# Patient Record
Sex: Female | Born: 1960 | Race: White | Hispanic: No | Marital: Married | Smoking: Former smoker
Health system: Southern US, Community
[De-identification: ages and names within clinical notes are randomized; demographics above are authoritative.]

## PROBLEM LIST (undated history)

## (undated) DIAGNOSIS — E785 Hyperlipidemia, unspecified: Secondary | ICD-10-CM

## (undated) HISTORY — DX: Hyperlipidemia, unspecified: E78.5

---

## 2011-10-19 ENCOUNTER — Other Ambulatory Visit: Payer: Self-pay | Admitting: Anesthesiology

## 2011-10-19 DIAGNOSIS — Z1231 Encounter for screening mammogram for malignant neoplasm of breast: Secondary | ICD-10-CM

## 2011-11-16 ENCOUNTER — Ambulatory Visit
Admission: RE | Admit: 2011-11-16 | Discharge: 2011-11-16 | Disposition: A | Discharge status: Home / Self Care | Payer: BC Managed Care – PPO | Source: Ambulatory Visit | Attending: Anesthesiology | Admitting: Anesthesiology

## 2011-11-16 DIAGNOSIS — Z1231 Encounter for screening mammogram for malignant neoplasm of breast: Secondary | ICD-10-CM

## 2012-11-14 ENCOUNTER — Other Ambulatory Visit: Payer: Self-pay | Admitting: Internal Medicine

## 2012-11-14 DIAGNOSIS — Z1231 Encounter for screening mammogram for malignant neoplasm of breast: Secondary | ICD-10-CM

## 2012-12-23 ENCOUNTER — Ambulatory Visit
Admission: RE | Admit: 2012-12-23 | Discharge: 2012-12-23 | Disposition: A | Discharge status: Home / Self Care | Payer: BC Managed Care – PPO | Source: Ambulatory Visit | Attending: Internal Medicine | Admitting: Internal Medicine

## 2012-12-23 DIAGNOSIS — Z1231 Encounter for screening mammogram for malignant neoplasm of breast: Secondary | ICD-10-CM

## 2013-12-01 ENCOUNTER — Other Ambulatory Visit: Payer: Self-pay

## 2013-12-01 DIAGNOSIS — Z1231 Encounter for screening mammogram for malignant neoplasm of breast: Secondary | ICD-10-CM

## 2013-12-29 ENCOUNTER — Ambulatory Visit
Admission: RE | Admit: 2013-12-29 | Discharge: 2013-12-29 | Disposition: A | Discharge status: Home / Self Care | Payer: BC Managed Care – PPO | Source: Ambulatory Visit

## 2013-12-29 DIAGNOSIS — Z1231 Encounter for screening mammogram for malignant neoplasm of breast: Secondary | ICD-10-CM

## 2014-01-12 ENCOUNTER — Other Ambulatory Visit (HOSPITAL_COMMUNITY)
Admission: RE | Admit: 2014-01-12 | Discharge: 2014-01-12 | Disposition: A | Discharge status: Home / Self Care | Payer: BC Managed Care – PPO | Source: Ambulatory Visit | Attending: Internal Medicine | Admitting: Internal Medicine

## 2014-01-12 DIAGNOSIS — Z01419 Encounter for gynecological examination (general) (routine) without abnormal findings: Secondary | ICD-10-CM | POA: Insufficient documentation

## 2014-12-14 ENCOUNTER — Other Ambulatory Visit: Payer: Self-pay

## 2014-12-14 DIAGNOSIS — Z1231 Encounter for screening mammogram for malignant neoplasm of breast: Secondary | ICD-10-CM

## 2014-12-30 ENCOUNTER — Ambulatory Visit
Admission: RE | Admit: 2014-12-30 | Discharge: 2014-12-30 | Disposition: A | Discharge status: Home / Self Care | Payer: BLUE CROSS/BLUE SHIELD | Source: Ambulatory Visit

## 2014-12-30 DIAGNOSIS — Z1231 Encounter for screening mammogram for malignant neoplasm of breast: Secondary | ICD-10-CM

## 2015-12-30 ENCOUNTER — Other Ambulatory Visit: Payer: Self-pay

## 2015-12-30 DIAGNOSIS — Z1231 Encounter for screening mammogram for malignant neoplasm of breast: Secondary | ICD-10-CM

## 2016-01-14 ENCOUNTER — Ambulatory Visit
Admission: RE | Admit: 2016-01-14 | Discharge: 2016-01-14 | Disposition: A | Discharge status: Home / Self Care | Payer: BLUE CROSS/BLUE SHIELD | Source: Ambulatory Visit

## 2016-01-14 DIAGNOSIS — Z1231 Encounter for screening mammogram for malignant neoplasm of breast: Secondary | ICD-10-CM

## 2016-12-25 ENCOUNTER — Other Ambulatory Visit: Payer: Self-pay | Admitting: Internal Medicine

## 2016-12-25 DIAGNOSIS — Z1231 Encounter for screening mammogram for malignant neoplasm of breast: Secondary | ICD-10-CM

## 2017-01-16 ENCOUNTER — Ambulatory Visit
Admission: RE | Admit: 2017-01-16 | Discharge: 2017-01-16 | Disposition: A | Discharge status: Home / Self Care | Payer: BLUE CROSS/BLUE SHIELD | Source: Ambulatory Visit | Attending: Internal Medicine | Admitting: Internal Medicine

## 2017-01-16 DIAGNOSIS — Z1231 Encounter for screening mammogram for malignant neoplasm of breast: Secondary | ICD-10-CM | POA: Diagnosis not present

## 2017-02-21 DIAGNOSIS — N390 Urinary tract infection, site not specified: Secondary | ICD-10-CM | POA: Diagnosis not present

## 2017-02-21 DIAGNOSIS — E559 Vitamin D deficiency, unspecified: Secondary | ICD-10-CM | POA: Diagnosis not present

## 2017-02-21 DIAGNOSIS — Z0001 Encounter for general adult medical examination with abnormal findings: Secondary | ICD-10-CM | POA: Diagnosis not present

## 2017-02-21 DIAGNOSIS — R7301 Impaired fasting glucose: Secondary | ICD-10-CM | POA: Diagnosis not present

## 2017-02-23 DIAGNOSIS — D2261 Melanocytic nevi of right upper limb, including shoulder: Secondary | ICD-10-CM | POA: Diagnosis not present

## 2017-02-23 DIAGNOSIS — D2362 Other benign neoplasm of skin of left upper limb, including shoulder: Secondary | ICD-10-CM | POA: Diagnosis not present

## 2017-02-23 DIAGNOSIS — D2272 Melanocytic nevi of left lower limb, including hip: Secondary | ICD-10-CM | POA: Diagnosis not present

## 2017-02-23 DIAGNOSIS — D225 Melanocytic nevi of trunk: Secondary | ICD-10-CM | POA: Diagnosis not present

## 2017-02-28 DIAGNOSIS — E559 Vitamin D deficiency, unspecified: Secondary | ICD-10-CM | POA: Diagnosis not present

## 2017-02-28 DIAGNOSIS — Z0000 Encounter for general adult medical examination without abnormal findings: Secondary | ICD-10-CM | POA: Diagnosis not present

## 2017-02-28 DIAGNOSIS — R7301 Impaired fasting glucose: Secondary | ICD-10-CM | POA: Diagnosis not present

## 2017-02-28 DIAGNOSIS — E785 Hyperlipidemia, unspecified: Secondary | ICD-10-CM | POA: Diagnosis not present

## 2018-02-22 ENCOUNTER — Other Ambulatory Visit: Payer: Self-pay | Admitting: Internal Medicine

## 2018-02-22 DIAGNOSIS — Z139 Encounter for screening, unspecified: Secondary | ICD-10-CM

## 2018-03-01 DIAGNOSIS — C44519 Basal cell carcinoma of skin of other part of trunk: Secondary | ICD-10-CM | POA: Diagnosis not present

## 2018-03-01 DIAGNOSIS — D2261 Melanocytic nevi of right upper limb, including shoulder: Secondary | ICD-10-CM | POA: Diagnosis not present

## 2018-03-01 DIAGNOSIS — D225 Melanocytic nevi of trunk: Secondary | ICD-10-CM | POA: Diagnosis not present

## 2018-03-01 DIAGNOSIS — D171 Benign lipomatous neoplasm of skin and subcutaneous tissue of trunk: Secondary | ICD-10-CM | POA: Diagnosis not present

## 2018-03-01 DIAGNOSIS — D2362 Other benign neoplasm of skin of left upper limb, including shoulder: Secondary | ICD-10-CM | POA: Diagnosis not present

## 2018-03-08 DIAGNOSIS — C44519 Basal cell carcinoma of skin of other part of trunk: Secondary | ICD-10-CM | POA: Diagnosis not present

## 2018-03-26 ENCOUNTER — Ambulatory Visit
Admission: RE | Admit: 2018-03-26 | Discharge: 2018-03-26 | Disposition: A | Discharge status: Home / Self Care | Payer: BLUE CROSS/BLUE SHIELD | Source: Ambulatory Visit | Attending: Internal Medicine | Admitting: Internal Medicine

## 2018-03-26 DIAGNOSIS — Z1231 Encounter for screening mammogram for malignant neoplasm of breast: Secondary | ICD-10-CM | POA: Diagnosis not present

## 2018-03-26 DIAGNOSIS — Z139 Encounter for screening, unspecified: Secondary | ICD-10-CM

## 2018-09-23 DIAGNOSIS — L03012 Cellulitis of left finger: Secondary | ICD-10-CM | POA: Diagnosis not present

## 2018-11-13 DIAGNOSIS — E785 Hyperlipidemia, unspecified: Secondary | ICD-10-CM | POA: Diagnosis not present

## 2018-11-13 DIAGNOSIS — S6990XA Unspecified injury of unspecified wrist, hand and finger(s), initial encounter: Secondary | ICD-10-CM | POA: Diagnosis not present

## 2018-12-16 DIAGNOSIS — S61211A Laceration without foreign body of left index finger without damage to nail, initial encounter: Secondary | ICD-10-CM | POA: Diagnosis not present

## 2018-12-18 DIAGNOSIS — M25542 Pain in joints of left hand: Secondary | ICD-10-CM | POA: Diagnosis not present

## 2018-12-18 DIAGNOSIS — R609 Edema, unspecified: Secondary | ICD-10-CM | POA: Diagnosis not present

## 2018-12-18 DIAGNOSIS — M25642 Stiffness of left hand, not elsewhere classified: Secondary | ICD-10-CM | POA: Diagnosis not present

## 2018-12-18 DIAGNOSIS — M6281 Muscle weakness (generalized): Secondary | ICD-10-CM | POA: Diagnosis not present

## 2018-12-20 DIAGNOSIS — N390 Urinary tract infection, site not specified: Secondary | ICD-10-CM | POA: Diagnosis not present

## 2018-12-20 DIAGNOSIS — R7301 Impaired fasting glucose: Secondary | ICD-10-CM | POA: Diagnosis not present

## 2018-12-20 DIAGNOSIS — E785 Hyperlipidemia, unspecified: Secondary | ICD-10-CM | POA: Diagnosis not present

## 2018-12-20 DIAGNOSIS — Z0000 Encounter for general adult medical examination without abnormal findings: Secondary | ICD-10-CM | POA: Diagnosis not present

## 2018-12-25 DIAGNOSIS — M6281 Muscle weakness (generalized): Secondary | ICD-10-CM | POA: Diagnosis not present

## 2018-12-25 DIAGNOSIS — M25642 Stiffness of left hand, not elsewhere classified: Secondary | ICD-10-CM | POA: Diagnosis not present

## 2018-12-25 DIAGNOSIS — M25542 Pain in joints of left hand: Secondary | ICD-10-CM | POA: Diagnosis not present

## 2018-12-25 DIAGNOSIS — R609 Edema, unspecified: Secondary | ICD-10-CM | POA: Diagnosis not present

## 2018-12-27 DIAGNOSIS — Z0000 Encounter for general adult medical examination without abnormal findings: Secondary | ICD-10-CM | POA: Diagnosis not present

## 2018-12-27 DIAGNOSIS — R7301 Impaired fasting glucose: Secondary | ICD-10-CM | POA: Diagnosis not present

## 2018-12-27 DIAGNOSIS — E785 Hyperlipidemia, unspecified: Secondary | ICD-10-CM | POA: Diagnosis not present

## 2018-12-27 DIAGNOSIS — S6990XA Unspecified injury of unspecified wrist, hand and finger(s), initial encounter: Secondary | ICD-10-CM | POA: Diagnosis not present

## 2019-01-01 DIAGNOSIS — M25542 Pain in joints of left hand: Secondary | ICD-10-CM | POA: Diagnosis not present

## 2019-01-01 DIAGNOSIS — M25642 Stiffness of left hand, not elsewhere classified: Secondary | ICD-10-CM | POA: Diagnosis not present

## 2019-01-01 DIAGNOSIS — R609 Edema, unspecified: Secondary | ICD-10-CM | POA: Diagnosis not present

## 2019-01-01 DIAGNOSIS — M6281 Muscle weakness (generalized): Secondary | ICD-10-CM | POA: Diagnosis not present

## 2019-01-14 DIAGNOSIS — M25542 Pain in joints of left hand: Secondary | ICD-10-CM | POA: Diagnosis not present

## 2019-01-14 DIAGNOSIS — M6281 Muscle weakness (generalized): Secondary | ICD-10-CM | POA: Diagnosis not present

## 2019-01-14 DIAGNOSIS — M25642 Stiffness of left hand, not elsewhere classified: Secondary | ICD-10-CM | POA: Diagnosis not present

## 2019-01-14 DIAGNOSIS — R609 Edema, unspecified: Secondary | ICD-10-CM | POA: Diagnosis not present

## 2019-01-15 DIAGNOSIS — S61211D Laceration without foreign body of left index finger without damage to nail, subsequent encounter: Secondary | ICD-10-CM | POA: Diagnosis not present

## 2020-02-06 DIAGNOSIS — N390 Urinary tract infection, site not specified: Secondary | ICD-10-CM | POA: Diagnosis not present

## 2020-02-06 DIAGNOSIS — E559 Vitamin D deficiency, unspecified: Secondary | ICD-10-CM | POA: Diagnosis not present

## 2020-02-06 DIAGNOSIS — E7800 Pure hypercholesterolemia, unspecified: Secondary | ICD-10-CM | POA: Diagnosis not present

## 2020-02-06 DIAGNOSIS — Z0000 Encounter for general adult medical examination without abnormal findings: Secondary | ICD-10-CM | POA: Diagnosis not present

## 2020-03-02 DIAGNOSIS — D2262 Melanocytic nevi of left upper limb, including shoulder: Secondary | ICD-10-CM | POA: Diagnosis not present

## 2020-03-02 DIAGNOSIS — D225 Melanocytic nevi of trunk: Secondary | ICD-10-CM | POA: Diagnosis not present

## 2020-03-02 DIAGNOSIS — D2362 Other benign neoplasm of skin of left upper limb, including shoulder: Secondary | ICD-10-CM | POA: Diagnosis not present

## 2020-03-02 DIAGNOSIS — D2261 Melanocytic nevi of right upper limb, including shoulder: Secondary | ICD-10-CM | POA: Diagnosis not present

## 2020-03-16 DIAGNOSIS — Z0000 Encounter for general adult medical examination without abnormal findings: Secondary | ICD-10-CM | POA: Diagnosis not present

## 2020-03-19 ENCOUNTER — Other Ambulatory Visit: Payer: Self-pay | Admitting: Internal Medicine

## 2020-03-19 ENCOUNTER — Other Ambulatory Visit: Payer: Self-pay | Admitting: Registered Nurse

## 2020-03-19 DIAGNOSIS — Z1231 Encounter for screening mammogram for malignant neoplasm of breast: Secondary | ICD-10-CM

## 2020-04-23 ENCOUNTER — Ambulatory Visit
Admission: RE | Admit: 2020-04-23 | Discharge: 2020-04-23 | Disposition: A | Discharge status: Home / Self Care | Payer: BC Managed Care – PPO | Source: Ambulatory Visit | Attending: Internal Medicine | Admitting: Internal Medicine

## 2020-04-23 ENCOUNTER — Other Ambulatory Visit: Payer: Self-pay

## 2020-04-23 DIAGNOSIS — Z1231 Encounter for screening mammogram for malignant neoplasm of breast: Secondary | ICD-10-CM

## 2020-05-11 DIAGNOSIS — Z20822 Contact with and (suspected) exposure to covid-19: Secondary | ICD-10-CM | POA: Diagnosis not present

## 2020-07-20 DIAGNOSIS — E559 Vitamin D deficiency, unspecified: Secondary | ICD-10-CM | POA: Diagnosis not present

## 2020-07-20 DIAGNOSIS — R7301 Impaired fasting glucose: Secondary | ICD-10-CM | POA: Diagnosis not present

## 2020-07-20 DIAGNOSIS — E785 Hyperlipidemia, unspecified: Secondary | ICD-10-CM | POA: Diagnosis not present

## 2020-07-20 DIAGNOSIS — Z0000 Encounter for general adult medical examination without abnormal findings: Secondary | ICD-10-CM | POA: Diagnosis not present

## 2020-07-27 DIAGNOSIS — E559 Vitamin D deficiency, unspecified: Secondary | ICD-10-CM | POA: Diagnosis not present

## 2020-07-27 DIAGNOSIS — E785 Hyperlipidemia, unspecified: Secondary | ICD-10-CM | POA: Diagnosis not present

## 2020-07-27 DIAGNOSIS — E663 Overweight: Secondary | ICD-10-CM | POA: Diagnosis not present

## 2020-07-27 DIAGNOSIS — R7301 Impaired fasting glucose: Secondary | ICD-10-CM | POA: Diagnosis not present

## 2020-09-29 DIAGNOSIS — E785 Hyperlipidemia, unspecified: Secondary | ICD-10-CM | POA: Diagnosis not present

## 2021-03-02 DIAGNOSIS — D2261 Melanocytic nevi of right upper limb, including shoulder: Secondary | ICD-10-CM | POA: Diagnosis not present

## 2021-03-02 DIAGNOSIS — D2262 Melanocytic nevi of left upper limb, including shoulder: Secondary | ICD-10-CM | POA: Diagnosis not present

## 2021-03-02 DIAGNOSIS — D2362 Other benign neoplasm of skin of left upper limb, including shoulder: Secondary | ICD-10-CM | POA: Diagnosis not present

## 2021-03-02 DIAGNOSIS — L814 Other melanin hyperpigmentation: Secondary | ICD-10-CM | POA: Diagnosis not present

## 2021-03-15 DIAGNOSIS — E785 Hyperlipidemia, unspecified: Secondary | ICD-10-CM | POA: Diagnosis not present

## 2021-03-15 DIAGNOSIS — E559 Vitamin D deficiency, unspecified: Secondary | ICD-10-CM | POA: Diagnosis not present

## 2021-03-15 DIAGNOSIS — Z0000 Encounter for general adult medical examination without abnormal findings: Secondary | ICD-10-CM | POA: Diagnosis not present

## 2021-03-15 DIAGNOSIS — R7301 Impaired fasting glucose: Secondary | ICD-10-CM | POA: Diagnosis not present

## 2021-03-15 DIAGNOSIS — R946 Abnormal results of thyroid function studies: Secondary | ICD-10-CM | POA: Diagnosis not present

## 2021-03-23 ENCOUNTER — Other Ambulatory Visit: Payer: Self-pay | Admitting: Registered Nurse

## 2021-03-23 DIAGNOSIS — Z1231 Encounter for screening mammogram for malignant neoplasm of breast: Secondary | ICD-10-CM

## 2021-04-15 DIAGNOSIS — E559 Vitamin D deficiency, unspecified: Secondary | ICD-10-CM | POA: Diagnosis not present

## 2021-04-15 DIAGNOSIS — E785 Hyperlipidemia, unspecified: Secondary | ICD-10-CM | POA: Diagnosis not present

## 2021-04-15 DIAGNOSIS — Z0000 Encounter for general adult medical examination without abnormal findings: Secondary | ICD-10-CM | POA: Diagnosis not present

## 2021-04-18 ENCOUNTER — Other Ambulatory Visit: Payer: Self-pay | Admitting: Internal Medicine

## 2021-04-18 DIAGNOSIS — E785 Hyperlipidemia, unspecified: Secondary | ICD-10-CM

## 2021-04-20 ENCOUNTER — Emergency Department (HOSPITAL_COMMUNITY): Payer: BC Managed Care – PPO

## 2021-04-20 ENCOUNTER — Encounter (HOSPITAL_COMMUNITY): Payer: Self-pay | Admitting: *Deleted

## 2021-04-20 ENCOUNTER — Emergency Department (HOSPITAL_COMMUNITY)
Admission: EM | Admit: 2021-04-20 | Discharge: 2021-04-20 | Disposition: A | Discharge status: Home / Self Care | Payer: BC Managed Care – PPO | Attending: Emergency Medicine | Admitting: Emergency Medicine

## 2021-04-20 DIAGNOSIS — S6701XA Crushing injury of right thumb, initial encounter: Secondary | ICD-10-CM | POA: Diagnosis not present

## 2021-04-20 DIAGNOSIS — S6991XA Unspecified injury of right wrist, hand and finger(s), initial encounter: Secondary | ICD-10-CM | POA: Diagnosis not present

## 2021-04-20 DIAGNOSIS — S60011A Contusion of right thumb without damage to nail, initial encounter: Secondary | ICD-10-CM | POA: Diagnosis not present

## 2021-04-20 DIAGNOSIS — W230XXA Caught, crushed, jammed, or pinched between moving objects, initial encounter: Secondary | ICD-10-CM | POA: Insufficient documentation

## 2021-04-20 NOTE — Discharge Instructions (Addendum)
Your x-ray is negative for any fractures.  Please rest ice and elevate.  You can use peas or corn to provide cold to your thumb.  History of plenty of water and take Tylenol ibuprofen as discussed below.  Please use Tylenol or ibuprofen for pain.  You may use 600 mg ibuprofen every 6 hours or 1000 mg of Tylenol every 6 hours.  You may choose to alternate between the 2.  This would be most effective.  Not to exceed 4 g of Tylenol within 24 hours.  Not to exceed 3200 mg ibuprofen 24 hours.

## 2021-04-20 NOTE — ED Triage Notes (Signed)
Pt complains of right thumb pain since slamming it in a car door today. She has wound to right lateral thumb, bleeding controlled. Hurts to move thumb

## 2021-04-20 NOTE — ED Provider Notes (Signed)
[facility]-EMERGENCY DEPT Provider Note   CSN: [id number] Arrival date & time: 04/20/21  1311     History Chief Complaint  Patient presents with  . Hand Injury    Natalie Evans is a 60 y.o. female.  HPI Patient is a 60 year old female has a past medical history without any pertinent positives presented today with right thumb pain.  She states that she smashed her distal phalanx of the right thumb in a car door just immediately prior to coming to the ER today.  She says she was concerned because of some bleeding that occurred from the edge of her thumb.  She states that she was able to control the bleeding with pressure but came to the ER for evaluation.  She states it is achy but improving in pain from her initial injury.    History reviewed. No pertinent past medical history.  There are no problems to display for this patient.   History reviewed. No pertinent surgical history.   OB History   No obstetric history on file.     No family history on file.     Home Medications Prior to Admission medications   Not on File    Allergies    Patient has no known allergies.  Review of Systems   Review of Systems  Constitutional: Negative for chills.  Respiratory: Negative for cough.   Gastrointestinal: Negative for vomiting.  Musculoskeletal:       R thumb pain  Skin: Negative for rash.    Physical Exam Updated Vital Signs BP (!) 156/83 (BP Location: Left Arm)   Pulse 68   Temp 98 F (36.7 C)   Resp 18   SpO2 97%   Physical Exam Vitals and nursing note reviewed.  Constitutional:      General: She is not in acute distress.    Appearance: Normal appearance. She is not ill-appearing.  HENT:     Head: Normocephalic and atraumatic.  Eyes:     General: No scleral icterus.       Right eye: No discharge.        Left eye: No discharge.     Conjunctiva/sclera: Conjunctivae normal.  Pulmonary:     Effort: Pulmonary effort is  normal.     Breath sounds: No stridor.  Musculoskeletal:     Comments: There is palpation of the distal phalanx of the right thumb.  There is no nail damage.  There is a small superficial laceration to the radial edge of the right thumb.  Range of motion of thumb at PIP DIP and no chest palpation of the hand palm forearm or elbow.  Skin:    General: Skin is warm and dry.  Neurological:     Mental Status: She is alert and oriented to person, place, and time. Mental status is at baseline.     ED Results / Procedures / Treatments   Labs (all labs ordered are listed, but only abnormal results are displayed) Labs Reviewed - No data to display  EKG None  Radiology DG Finger Thumb Right  Result Date: 04/20/2021 CLINICAL DATA:  Right thumb crush injury. Slammed thumb in car door. EXAM: RIGHT THUMB 2+V COMPARISON:  None. FINDINGS: The mineralization and alignment are normal. There is no evidence of acute fracture or dislocation. The joint spaces are preserved. No evidence of foreign body or soft tissue emphysema. IMPRESSION: No acute osseous findings. Electronically Signed   By: Carey Bullocks M.D.   On: 04/20/2021 14:39  Procedures Procedures   Medications Ordered in ED Medications - No data to display  ED Course  I have reviewed the triage vital signs and the nursing notes.  Pertinent labs & imaging results that were available during my care of the patient were reviewed by me and considered in my medical decision making (see chart for details).    MDM Rules/Calculators/A&P                          Patient with crush injury to the right thumb.  There is no gaping laceration that requires suturing however there is a small traumatic laceration that is no longer bleeding.  X-ray shows no fractures.  I personally reviewed this x-ray.  Agree with radiology read.  Patient is distally neurovascularly intact.  Will discharge with Tylenol ibuprofen and rice therapy.  Final Clinical  Impression(s) / ED Diagnoses Final diagnoses:  Contusion of right thumb without damage to nail, initial encounter    Rx / DC Orders ED Discharge Orders    None       Gailen Shelter, [place] 04/20/21 1446    Arby Barrette, MD 04/28/21 [phone]

## 2021-04-25 ENCOUNTER — Ambulatory Visit
Admission: RE | Admit: 2021-04-25 | Discharge: 2021-04-25 | Disposition: A | Discharge status: Home / Self Care | Payer: BC Managed Care – PPO | Source: Ambulatory Visit | Attending: Registered Nurse | Admitting: Registered Nurse

## 2021-04-25 ENCOUNTER — Other Ambulatory Visit: Payer: Self-pay

## 2021-04-25 ENCOUNTER — Ambulatory Visit: Payer: BC Managed Care – PPO

## 2021-04-25 DIAGNOSIS — Z1231 Encounter for screening mammogram for malignant neoplasm of breast: Secondary | ICD-10-CM | POA: Diagnosis not present

## 2021-05-18 ENCOUNTER — Ambulatory Visit
Admit: 2021-05-18 | Discharge: 2021-05-18 | Disposition: A | Discharge status: Home / Self Care | Payer: Self-pay | Attending: Internal Medicine | Admitting: Internal Medicine

## 2021-05-18 DIAGNOSIS — E785 Hyperlipidemia, unspecified: Secondary | ICD-10-CM

## 2021-05-28 IMAGING — MG DIGITAL SCREENING BILAT W/ TOMO W/ CAD
3 series · 3 of 7 positions shown · non-contrast
Comparison: Previous exam(s).

CLINICAL DATA: Screening.

EXAM:
DIGITAL SCREENING BILATERAL MAMMOGRAM WITH TOMO AND CAD

[R MLO synth-2D]
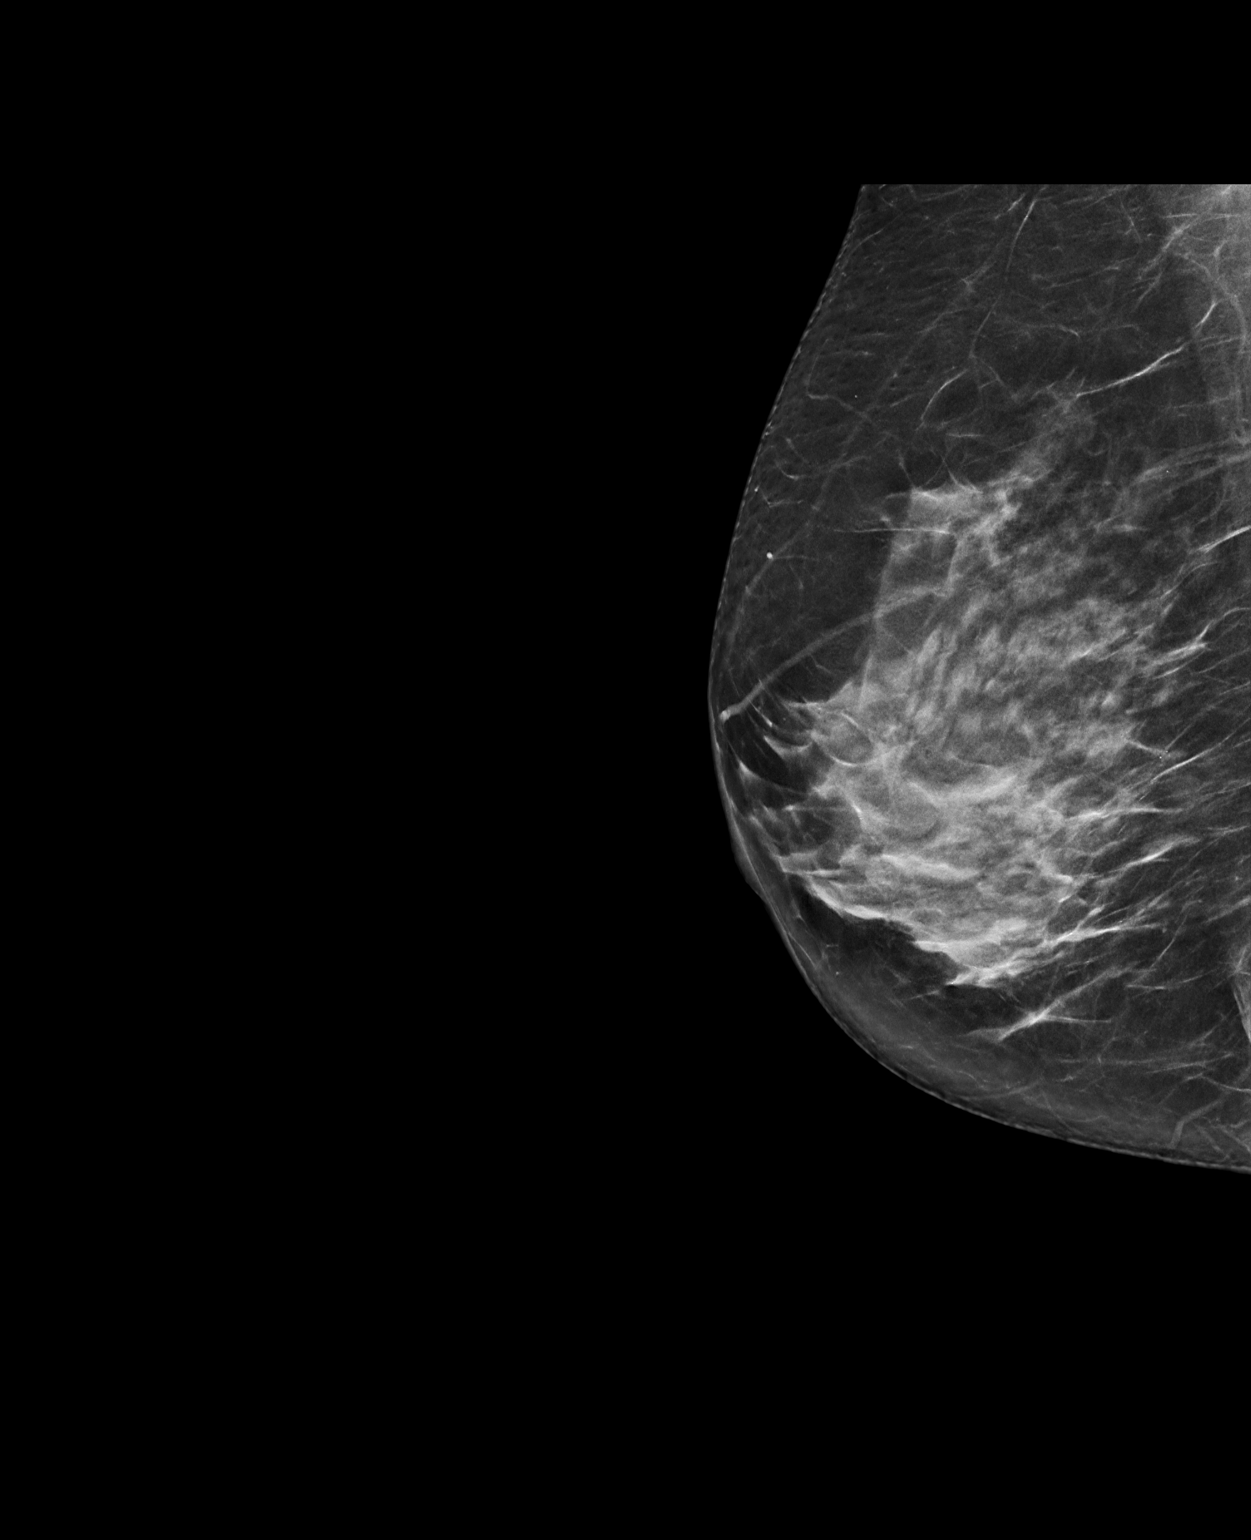

[L MLO synth-2D]
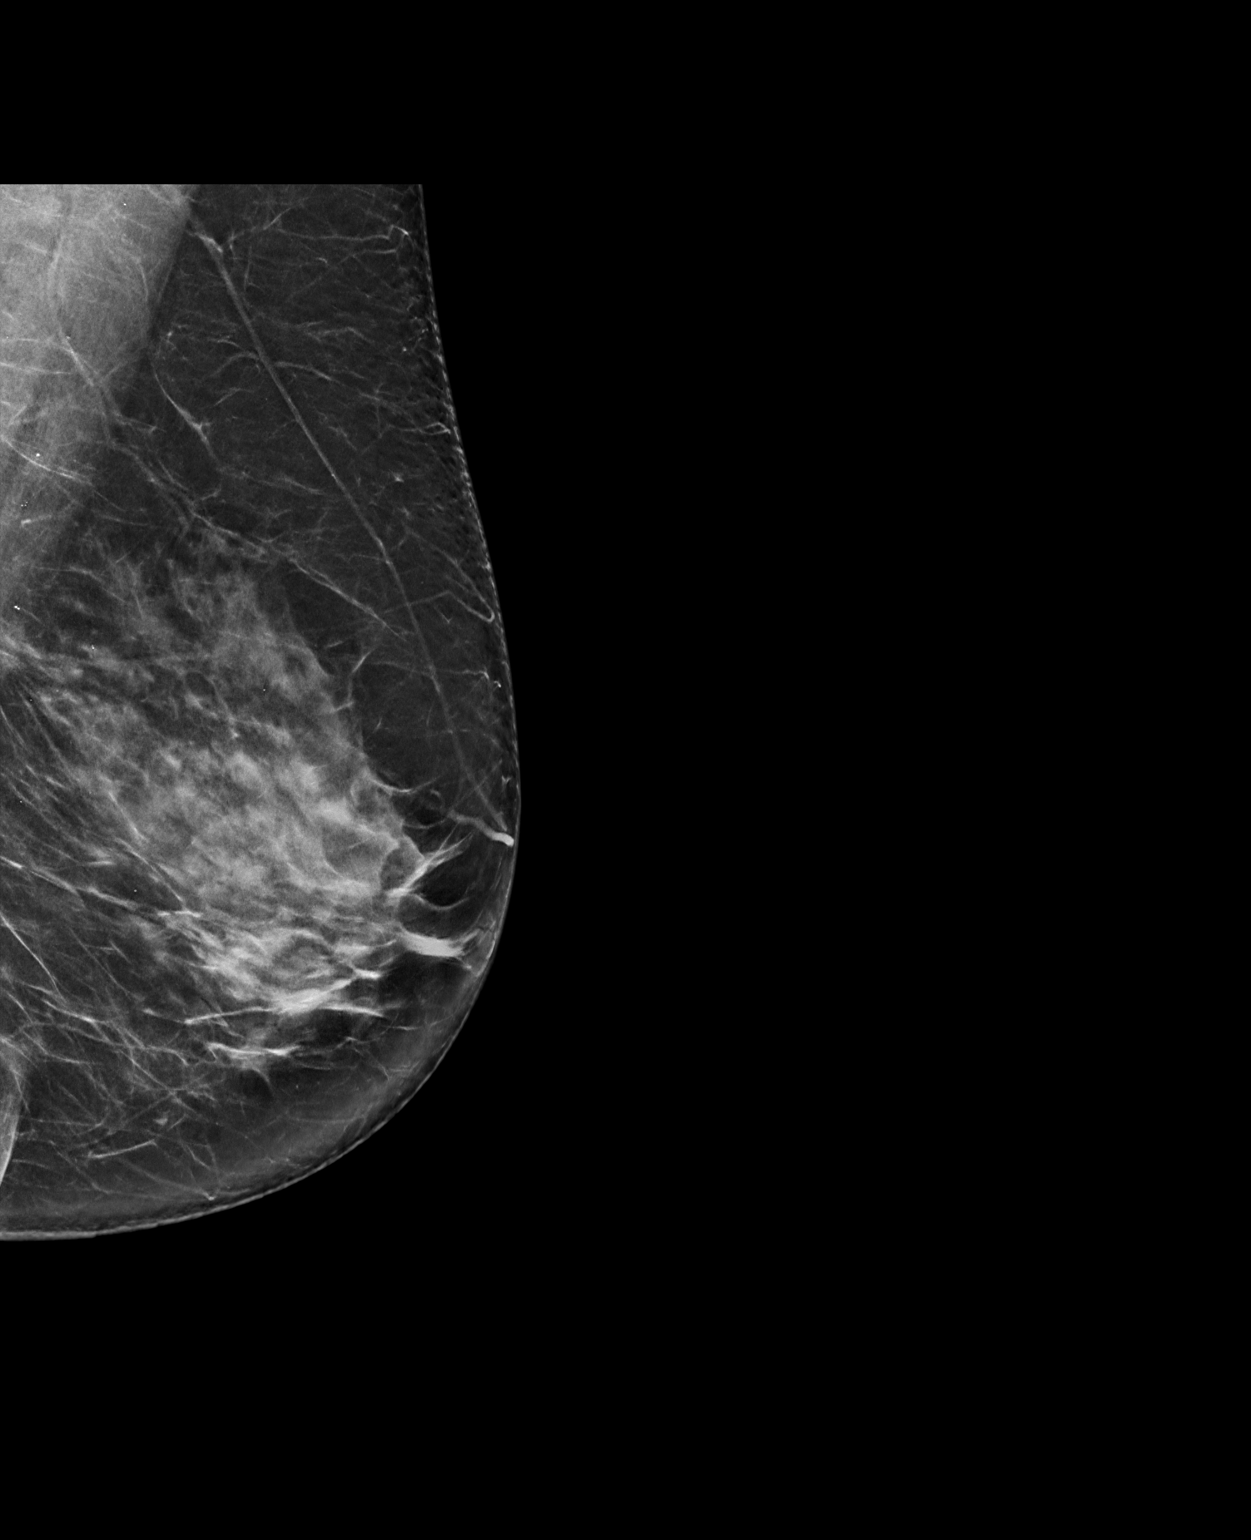

[R CC tomo · tomo slice 39/78.0]
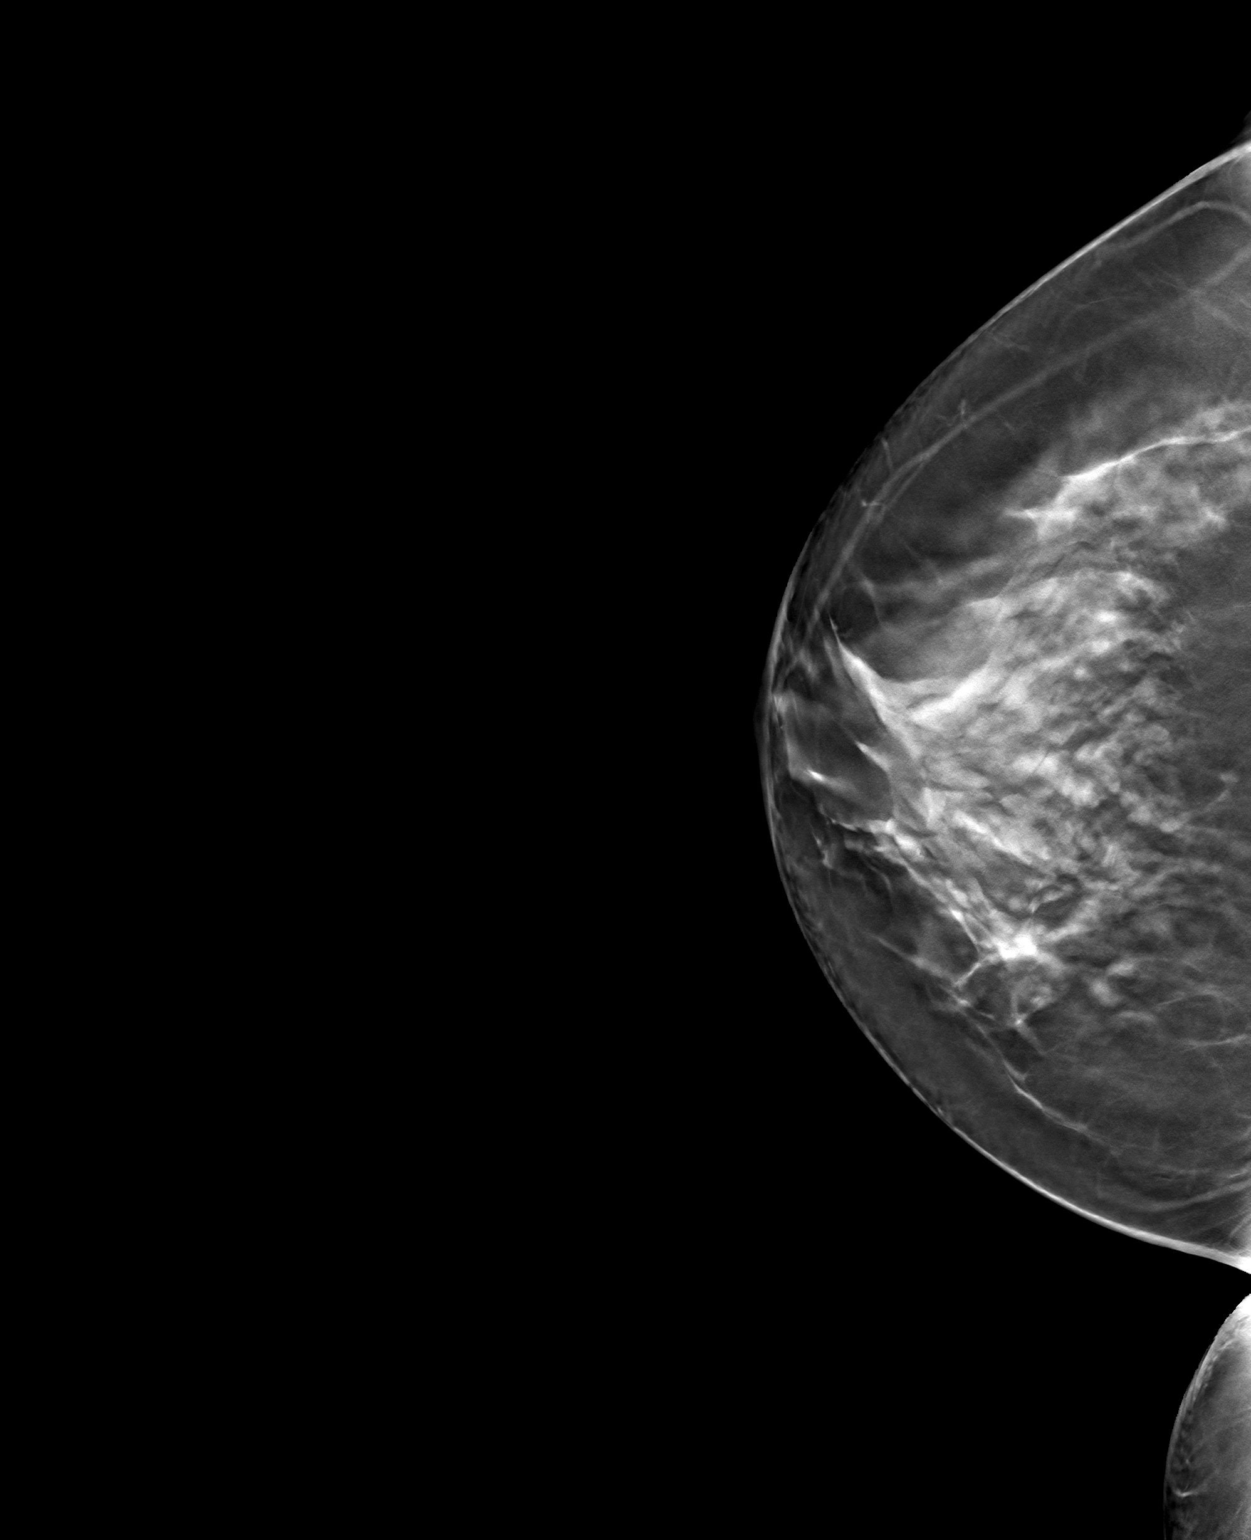

[3 of 7 positions shown; findings below may reference images not displayed]

ACR Breast Density Category c: The breast tissue is heterogeneously
dense, which may obscure small masses.
FINDINGS: There are no findings suspicious for malignancy. Images were
processed with CAD.
IMPRESSION: No mammographic evidence of malignancy. A result letter of this
screening mammogram will be mailed directly to the patient.

RECOMMENDATION:
Screening mammogram in one year. (Code:FT-U-LHB)

BI-RADS CATEGORY  1: Negative.

## 2021-10-31 DIAGNOSIS — E785 Hyperlipidemia, unspecified: Secondary | ICD-10-CM | POA: Diagnosis not present

## 2021-11-10 DIAGNOSIS — H43812 Vitreous degeneration, left eye: Secondary | ICD-10-CM | POA: Diagnosis not present

## 2022-03-02 DIAGNOSIS — L814 Other melanin hyperpigmentation: Secondary | ICD-10-CM | POA: Diagnosis not present

## 2022-03-02 DIAGNOSIS — D2261 Melanocytic nevi of right upper limb, including shoulder: Secondary | ICD-10-CM | POA: Diagnosis not present

## 2022-03-02 DIAGNOSIS — D2362 Other benign neoplasm of skin of left upper limb, including shoulder: Secondary | ICD-10-CM | POA: Diagnosis not present

## 2022-03-02 DIAGNOSIS — D2262 Melanocytic nevi of left upper limb, including shoulder: Secondary | ICD-10-CM | POA: Diagnosis not present

## 2022-03-23 ENCOUNTER — Other Ambulatory Visit: Payer: Self-pay | Admitting: Registered Nurse

## 2022-04-10 DIAGNOSIS — E559 Vitamin D deficiency, unspecified: Secondary | ICD-10-CM | POA: Diagnosis not present

## 2022-04-10 DIAGNOSIS — R7301 Impaired fasting glucose: Secondary | ICD-10-CM | POA: Diagnosis not present

## 2022-04-10 DIAGNOSIS — E785 Hyperlipidemia, unspecified: Secondary | ICD-10-CM | POA: Diagnosis not present

## 2022-04-10 DIAGNOSIS — R946 Abnormal results of thyroid function studies: Secondary | ICD-10-CM | POA: Diagnosis not present

## 2022-04-10 DIAGNOSIS — Z0000 Encounter for general adult medical examination without abnormal findings: Secondary | ICD-10-CM | POA: Diagnosis not present

## 2022-04-17 DIAGNOSIS — R55 Syncope and collapse: Secondary | ICD-10-CM | POA: Diagnosis not present

## 2022-04-17 DIAGNOSIS — E559 Vitamin D deficiency, unspecified: Secondary | ICD-10-CM | POA: Diagnosis not present

## 2022-04-17 DIAGNOSIS — Z0000 Encounter for general adult medical examination without abnormal findings: Secondary | ICD-10-CM | POA: Diagnosis not present

## 2022-04-17 DIAGNOSIS — E785 Hyperlipidemia, unspecified: Secondary | ICD-10-CM | POA: Diagnosis not present

## 2022-04-21 ENCOUNTER — Other Ambulatory Visit: Payer: Self-pay | Admitting: Registered Nurse

## 2022-04-21 DIAGNOSIS — Z1231 Encounter for screening mammogram for malignant neoplasm of breast: Secondary | ICD-10-CM

## 2022-05-18 ENCOUNTER — Ambulatory Visit: Payer: BC Managed Care – PPO | Admitting: Cardiology

## 2022-05-25 IMAGING — CR DG FINGER THUMB 2+V*R*
3 series · 3 of 3 positions shown · non-contrast
Comparison: None.

CLINICAL DATA: Right thumb crush injury. Slammed thumb in car door.

EXAM:
RIGHT THUMB 2+V

[x finger obl right]
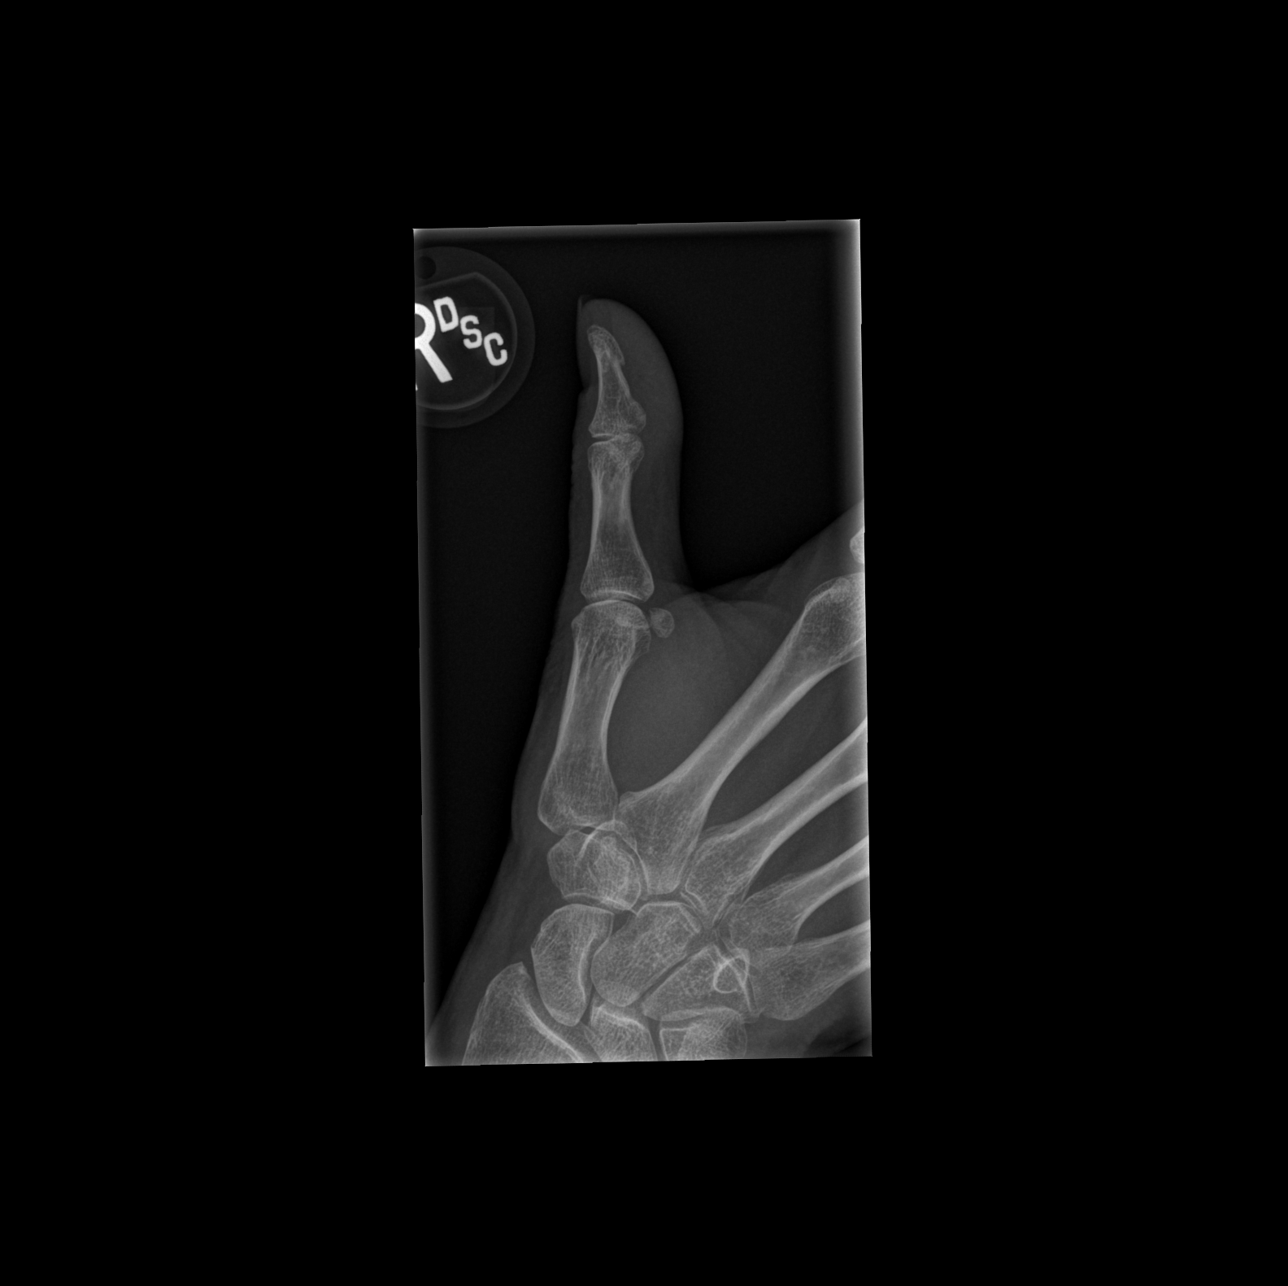

[x finger lat right]
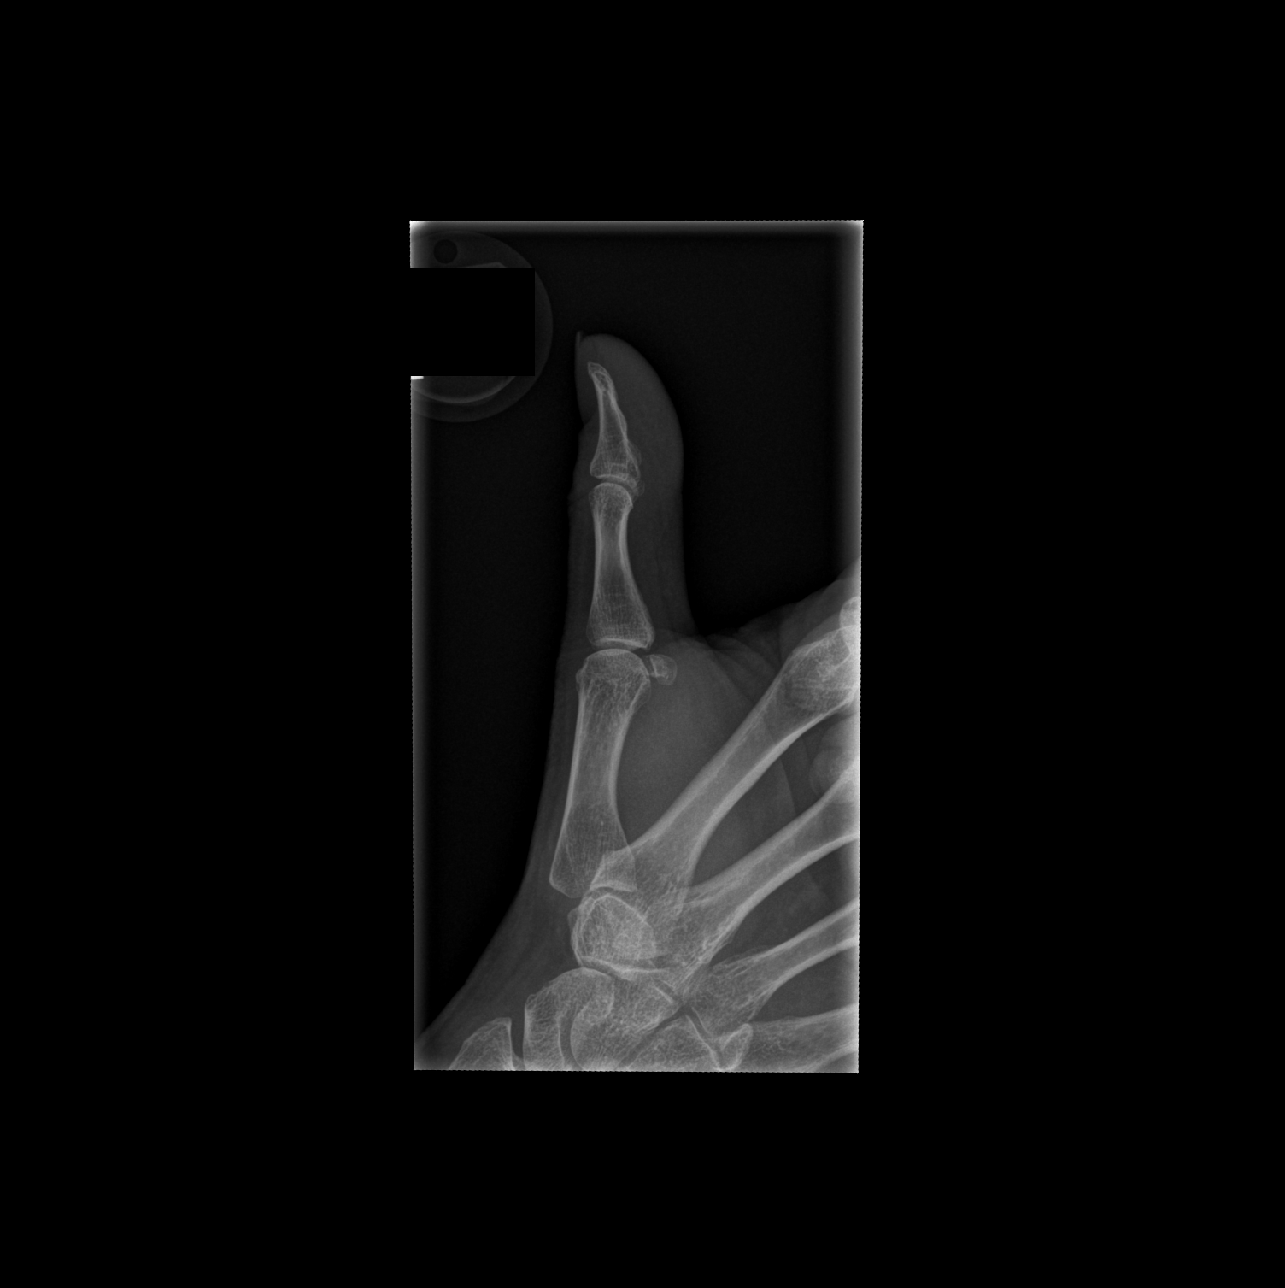

[x finger pa right]
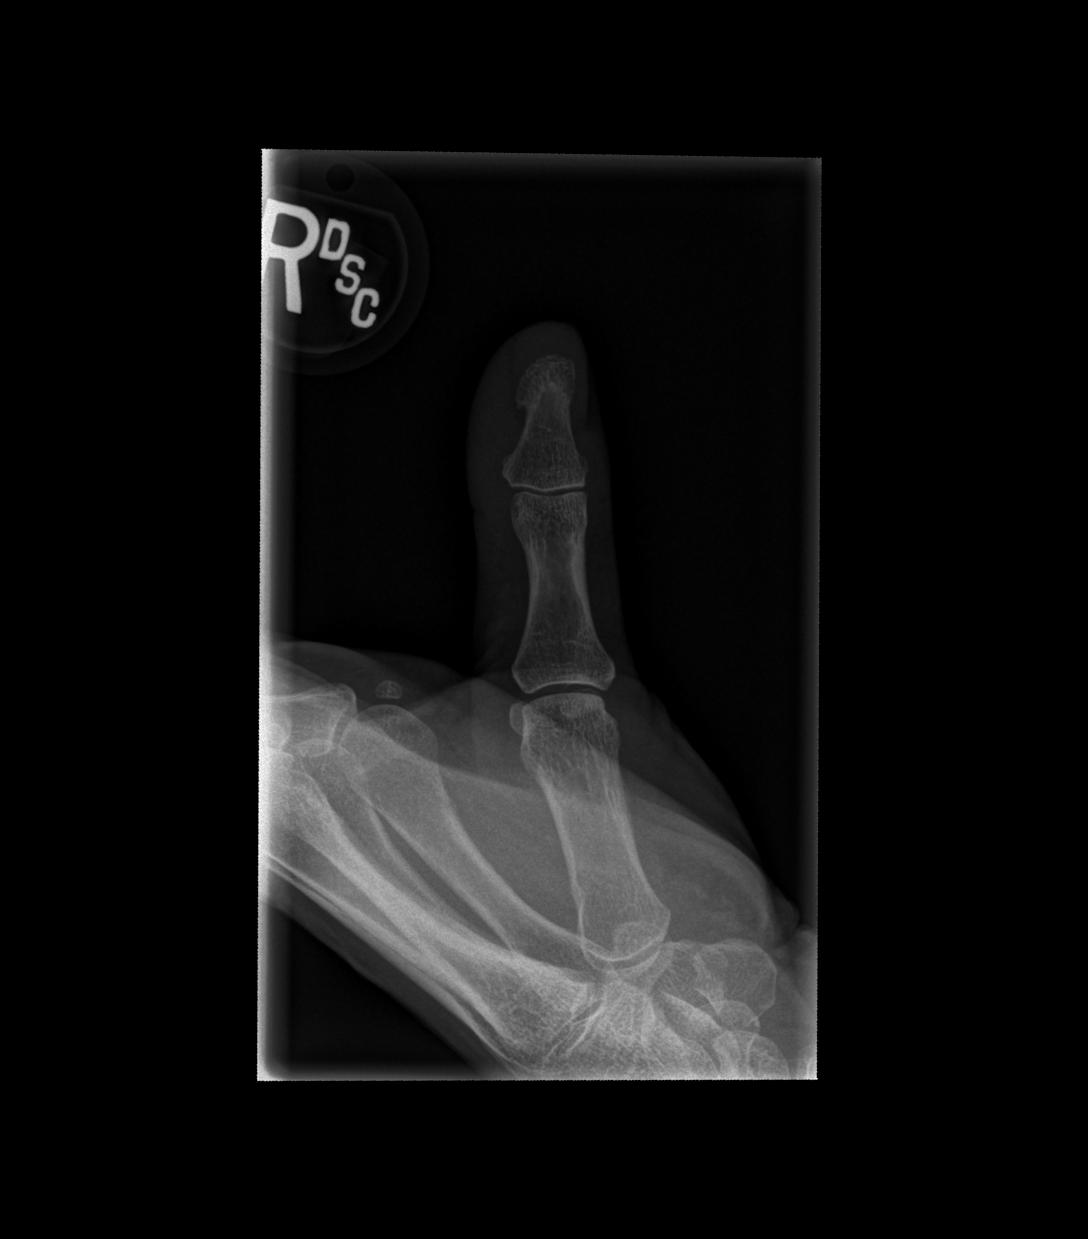

[3 of 3 positions shown; findings below may reference images not displayed]

FINDINGS: The mineralization and alignment are normal. There is no evidence of
acute fracture or dislocation. The joint spaces are preserved. No
evidence of foreign body or soft tissue emphysema.
IMPRESSION: No acute osseous findings.

## 2022-05-31 ENCOUNTER — Ambulatory Visit
Admission: RE | Admit: 2022-05-31 | Discharge: 2022-05-31 | Disposition: A | Discharge status: Home / Self Care | Payer: BC Managed Care – PPO | Source: Ambulatory Visit | Attending: Registered Nurse | Admitting: Registered Nurse

## 2022-05-31 DIAGNOSIS — Z1231 Encounter for screening mammogram for malignant neoplasm of breast: Secondary | ICD-10-CM | POA: Diagnosis not present

## 2022-06-14 ENCOUNTER — Ambulatory Visit: Payer: BC Managed Care – PPO | Admitting: Cardiology

## 2022-06-14 ENCOUNTER — Encounter: Payer: Self-pay | Admitting: Cardiology

## 2022-06-14 VITALS — BP 130/85 | HR 77 | Temp 97.5°F | Resp 16 | Ht 64.0 in | Wt 178.0 lb

## 2022-06-14 DIAGNOSIS — E7800 Pure hypercholesterolemia, unspecified: Secondary | ICD-10-CM

## 2022-06-14 DIAGNOSIS — R55 Syncope and collapse: Secondary | ICD-10-CM | POA: Diagnosis not present

## 2022-06-14 DIAGNOSIS — R030 Elevated blood-pressure reading, without diagnosis of hypertension: Secondary | ICD-10-CM | POA: Diagnosis not present

## 2022-06-14 NOTE — Progress Notes (Signed)
Primary Physician/Referring:  Fatima Sanger, FNP  Patient ID: Natalie Evans, female    DOB: 02-22-1961, 61 y.o.   MRN: [id number]  Chief Complaint  Patient presents with   Loss of Consciousness   New Patient (Initial Visit)    Natalie Chester, NP   HPI:    Gracianna Vink  is a 61 y.o. female patient referred to me for evaluation of 1 episode of syncope that occurred sometime in April 2023.  Patient had abdominal discomfort that night and in the middle of the night walk to the bathroom followed by syncopal spell.  No premonitory symptoms.  No postictal state.  No incontinence.  She has remote history of occasional cigarette use, quit many years ago.  She has no family history of premature coronary disease.  She continues to remain active, exercises regularly.  Past Medical History:  Diagnosis Date   Hyperlipidemia    History reviewed. No pertinent surgical history. Family History  Problem Relation Age of Onset   Hypertension Mother    Hyperlipidemia Mother    Heart disease Father    Hyperlipidemia Sister    Other Sister        Natalie Evans in her heart   Heart disease Maternal Aunt    Heart disease Maternal Uncle    Heart disease Maternal Uncle    Heart attack Maternal Grandmother        late 67's; more than one occurance   Stroke Maternal Grandmother     Social History   Tobacco Use   Smoking status: Former    Packs/day: 0.25    Years: 10.00    Total pack years: 2.50    Types: Cigarettes    Quit date: 1997    Years since quitting: 26.5   Smokeless tobacco: Never  Substance Use Topics   Alcohol use: Yes    Alcohol/week: 1.0 standard drink of alcohol    Types: 1 Glasses of wine per week    Comment: occasionally   Marital Status: Married  ROS  Review of Systems  Cardiovascular:  Negative for chest pain, dyspnea on exertion and leg swelling.   Objective  Blood pressure 130/85, pulse 77, temperature (!) 97.5 F (36.4 C), temperature source  Temporal, resp. rate 16, height 5\' 4"  (1.626 m), weight 178 lb (80.7 kg), SpO2 99 %. Body mass index is 30.55 kg/m.     06/14/2022   11:13 AM 06/14/2022   10:19 AM 04/20/2021    1:16 PM  Vitals with BMI  Height  5\' 4"    Weight  178 lbs   BMI  30.54   Systolic 130 138 06/20/2021  Diastolic 85 87 83  Pulse  77 68   Orthostatic VS for the past 72 hrs (Last 3 readings):  Orthostatic BP Patient Position BP Location Cuff Size Orthostatic Pulse  06/14/22 1031 152/76 Standing Left Arm Large 64  06/14/22 1030 155/77 Sitting Left Arm Large 62  06/14/22 1029 145/78 Supine Left Arm Large 62     Physical Exam Neck:     Vascular: No JVD.  Cardiovascular:     Rate and Rhythm: Normal rate and regular rhythm.     Pulses: Intact distal pulses.     Heart sounds: Murmur heard.     Early systolic murmur is present with a grade of 1/6 at the upper right sternal border.     No gallop.  Pulmonary:     Effort: Pulmonary effort is normal.     Breath sounds:  Normal breath sounds.  Abdominal:     General: Bowel sounds are normal.     Palpations: Abdomen is soft.  Musculoskeletal:     Right lower leg: No edema.     Left lower leg: No edema.    Medications and allergies  No Known Allergies   Medication list after today's encounter   Current Outpatient Medications:    Biotin 1 MG CAPS, , Disp: , Rfl:    Calcium-Vitamin D-Vitamin K (CALCIUM + D) [phone] MG-UNT-MCG CHEW, Chew 1 capsule by mouth See admin instructions., Disp: , Rfl:    Emollient (COLLAGEN EX), Apply topically., Disp: , Rfl:    rosuvastatin (CRESTOR) 10 MG tablet, Take 10 mg by mouth at bedtime., Disp: , Rfl:   Laboratory examination:   External labs:   Labs 04/10/2022:  A1c 5.3%.  TSH 1.13.  Vitamin D 34.7.  Hb 13.4/HCT 39.3, platelets 209, normal indicis.  BUN 16, creatinine 0.92, prior 67 mL, potassium 4.4.  LFTs normal.  Total cholesterol 176, triglycerides 81, HDL 68, LDL 92.  Radiology:    Cardiac Studies:   Coronary  calcium score 05/18/2021: LM 0 LAD 0.3 LCx 1.4 RCA 0.  Total Agatston score 1.7. MESA database percentile: 67.  Ascending and descending thoracic aortic measurements are normal.  No significant extracardiac abnormality.  EKG:   EKG 06/14/2022: Normal sinus rhythm at rate of 63 bpm, normal EKG.    Assessment     ICD-10-CM   1. Vasovagal syncope  R55 EKG 12-Lead    PCV ECHOCARDIOGRAM COMPLETE    2. Pure hypercholesterolemia  E78.00     3. Elevated BP without diagnosis of hypertension  R03.0        There are no discontinued medications.  No orders of the defined types were placed in this encounter.  Orders Placed This Encounter  Procedures   EKG 12-Lead   PCV ECHOCARDIOGRAM COMPLETE    Standing Status:   Future    Standing Expiration Date:   06/15/2023   Recommendations:   Natalie Evans is a 61 y.o. female patient referred to me for evaluation of 1 episode of syncope that occurred sometime in April 2023.  Patient had abdominal discomfort that night and in the middle of the night walk to the bathroom followed by syncopal spell.  No premonitory symptoms.  No postictal state.  No incontinence.  Her physical examination is essentially unremarkable, very soft physiologic murmur in the right sternal border but otherwise normal EKG as well.  I suspect her symptoms are related to vasovagal episode.  No further evaluation is indicated except I will obtain an echocardiogram to exclude any structural abnormalities and unless this is abnormal, I will see her back on a as needed basis.  I reviewed her external records, she does have hyperlipidemia, on present statin therapy LDL is at goal.  Coronary calcium score had revealed mild elevation in percentile at 68% ultrasound.  She is now taking statins on a regular basis.  No aspirin prophylaxis is indicated.  Her blood pressure is slightly elevated today probably because of whitecoat hypertension.  She has no history of hypertension.  Again  she will continue to observe this and keep a watch on blood pressure, goal blood pressure 130/80 mmHg.    Yates Decamp, MD, [facility] 06/14/2022, 11:13 AM Office: [phone]

## 2022-07-21 ENCOUNTER — Ambulatory Visit: Payer: BC Managed Care – PPO

## 2022-07-21 DIAGNOSIS — R55 Syncope and collapse: Secondary | ICD-10-CM

## 2022-07-22 NOTE — Progress Notes (Signed)
Wish her a Happy Birthday. Let her know the echocardiogram is normal with minor clinically insignificant abnormality. PCP also gets a copy of the Echo and has been faxed

## 2022-07-24 NOTE — Progress Notes (Signed)
Called to inform her about her lab results.

## 2023-04-16 DIAGNOSIS — E785 Hyperlipidemia, unspecified: Secondary | ICD-10-CM | POA: Diagnosis not present

## 2023-04-16 DIAGNOSIS — Z0000 Encounter for general adult medical examination without abnormal findings: Secondary | ICD-10-CM | POA: Diagnosis not present

## 2023-04-16 DIAGNOSIS — R7301 Impaired fasting glucose: Secondary | ICD-10-CM | POA: Diagnosis not present

## 2023-04-16 DIAGNOSIS — R946 Abnormal results of thyroid function studies: Secondary | ICD-10-CM | POA: Diagnosis not present

## 2023-04-16 DIAGNOSIS — E559 Vitamin D deficiency, unspecified: Secondary | ICD-10-CM | POA: Diagnosis not present

## 2023-04-23 DIAGNOSIS — Z23 Encounter for immunization: Secondary | ICD-10-CM | POA: Diagnosis not present

## 2023-04-23 DIAGNOSIS — Z01419 Encounter for gynecological examination (general) (routine) without abnormal findings: Secondary | ICD-10-CM | POA: Diagnosis not present

## 2023-04-23 DIAGNOSIS — Z0000 Encounter for general adult medical examination without abnormal findings: Secondary | ICD-10-CM | POA: Diagnosis not present

## 2023-04-24 DIAGNOSIS — D2362 Other benign neoplasm of skin of left upper limb, including shoulder: Secondary | ICD-10-CM | POA: Diagnosis not present

## 2023-04-24 DIAGNOSIS — D225 Melanocytic nevi of trunk: Secondary | ICD-10-CM | POA: Diagnosis not present

## 2023-04-24 DIAGNOSIS — D2262 Melanocytic nevi of left upper limb, including shoulder: Secondary | ICD-10-CM | POA: Diagnosis not present

## 2023-04-24 DIAGNOSIS — Z85828 Personal history of other malignant neoplasm of skin: Secondary | ICD-10-CM | POA: Diagnosis not present

## 2023-04-24 DIAGNOSIS — D2261 Melanocytic nevi of right upper limb, including shoulder: Secondary | ICD-10-CM | POA: Diagnosis not present

## 2023-04-26 DIAGNOSIS — E785 Hyperlipidemia, unspecified: Secondary | ICD-10-CM | POA: Diagnosis not present

## 2023-04-26 DIAGNOSIS — E559 Vitamin D deficiency, unspecified: Secondary | ICD-10-CM | POA: Diagnosis not present

## 2023-04-26 DIAGNOSIS — R7301 Impaired fasting glucose: Secondary | ICD-10-CM | POA: Diagnosis not present

## 2023-05-01 ENCOUNTER — Other Ambulatory Visit: Payer: Self-pay | Admitting: Registered Nurse

## 2023-05-01 DIAGNOSIS — Z1231 Encounter for screening mammogram for malignant neoplasm of breast: Secondary | ICD-10-CM

## 2023-06-05 ENCOUNTER — Ambulatory Visit
Admission: RE | Admit: 2023-06-05 | Discharge: 2023-06-05 | Disposition: A | Discharge status: Home / Self Care | Payer: BC Managed Care – PPO | Source: Ambulatory Visit | Attending: Registered Nurse | Admitting: Registered Nurse

## 2023-06-05 DIAGNOSIS — Z1231 Encounter for screening mammogram for malignant neoplasm of breast: Secondary | ICD-10-CM

## 2023-11-14 DIAGNOSIS — R7303 Prediabetes: Secondary | ICD-10-CM | POA: Diagnosis not present

## 2023-12-17 DIAGNOSIS — R197 Diarrhea, unspecified: Secondary | ICD-10-CM | POA: Diagnosis not present

## 2023-12-17 DIAGNOSIS — R531 Weakness: Secondary | ICD-10-CM | POA: Diagnosis not present

## 2023-12-18 DIAGNOSIS — R197 Diarrhea, unspecified: Secondary | ICD-10-CM | POA: Diagnosis not present

## 2024-01-15 DIAGNOSIS — Z1211 Encounter for screening for malignant neoplasm of colon: Secondary | ICD-10-CM | POA: Diagnosis not present

## 2024-01-15 DIAGNOSIS — R1032 Left lower quadrant pain: Secondary | ICD-10-CM | POA: Diagnosis not present

## 2024-01-15 DIAGNOSIS — R194 Change in bowel habit: Secondary | ICD-10-CM | POA: Diagnosis not present

## 2024-02-29 DIAGNOSIS — R194 Change in bowel habit: Secondary | ICD-10-CM | POA: Diagnosis not present

## 2024-05-15 ENCOUNTER — Other Ambulatory Visit: Payer: Self-pay | Admitting: Registered Nurse

## 2024-05-15 DIAGNOSIS — Z1231 Encounter for screening mammogram for malignant neoplasm of breast: Secondary | ICD-10-CM

## 2024-06-12 ENCOUNTER — Ambulatory Visit
Admission: RE | Admit: 2024-06-12 | Discharge: 2024-06-12 | Disposition: A | Discharge status: Home / Self Care | Source: Ambulatory Visit | Attending: Registered Nurse | Admitting: Registered Nurse

## 2024-06-12 DIAGNOSIS — Z1231 Encounter for screening mammogram for malignant neoplasm of breast: Secondary | ICD-10-CM | POA: Diagnosis not present

## 2024-11-18 DIAGNOSIS — H16143 Punctate keratitis, bilateral: Secondary | ICD-10-CM | POA: Diagnosis not present
